# Patient Record
Sex: Male | Born: 1985 | Race: Black or African American | Hispanic: No | Marital: Single | Smoking: Never smoker
Health system: Southern US, Community
[De-identification: ages and names within clinical notes are randomized; demographics above are authoritative.]

---

## 2008-02-19 ENCOUNTER — Emergency Department (HOSPITAL_COMMUNITY): Admission: EM | Admit: 2008-02-19 | Discharge: 2008-02-20 | Payer: Self-pay | Admitting: Emergency Medicine

## 2008-02-24 ENCOUNTER — Ambulatory Visit (HOSPITAL_COMMUNITY): Admission: RE | Admit: 2008-02-24 | Discharge: 2008-02-24 | Payer: Self-pay | Admitting: Orthopedic Surgery

## 2008-03-02 ENCOUNTER — Encounter: Admission: RE | Admit: 2008-03-02 | Discharge: 2008-03-09 | Payer: Self-pay | Admitting: Orthopedic Surgery

## 2008-08-22 ENCOUNTER — Emergency Department (HOSPITAL_COMMUNITY): Admission: EM | Admit: 2008-08-22 | Discharge: 2008-08-22 | Payer: Self-pay | Admitting: Family Medicine

## 2008-09-04 ENCOUNTER — Emergency Department (HOSPITAL_COMMUNITY): Admission: EM | Admit: 2008-09-04 | Discharge: 2008-09-04 | Payer: Self-pay | Admitting: Family Medicine

## 2008-10-04 ENCOUNTER — Emergency Department (HOSPITAL_COMMUNITY): Admission: EM | Admit: 2008-10-04 | Discharge: 2008-10-04 | Payer: Self-pay | Admitting: Emergency Medicine

## 2008-10-08 ENCOUNTER — Emergency Department (HOSPITAL_COMMUNITY): Admission: EM | Admit: 2008-10-08 | Discharge: 2008-10-08 | Payer: Self-pay | Admitting: Family Medicine

## 2008-12-17 IMAGING — CR DG HAND COMPLETE 3+V*R*
3 series · 3 of 3 positions shown · non-contrast
Comparison: None of

CLINICAL DATA: Right hand injury

RIGHT HAND - COMPLETE 3+ VIEW

[x hand pa right]
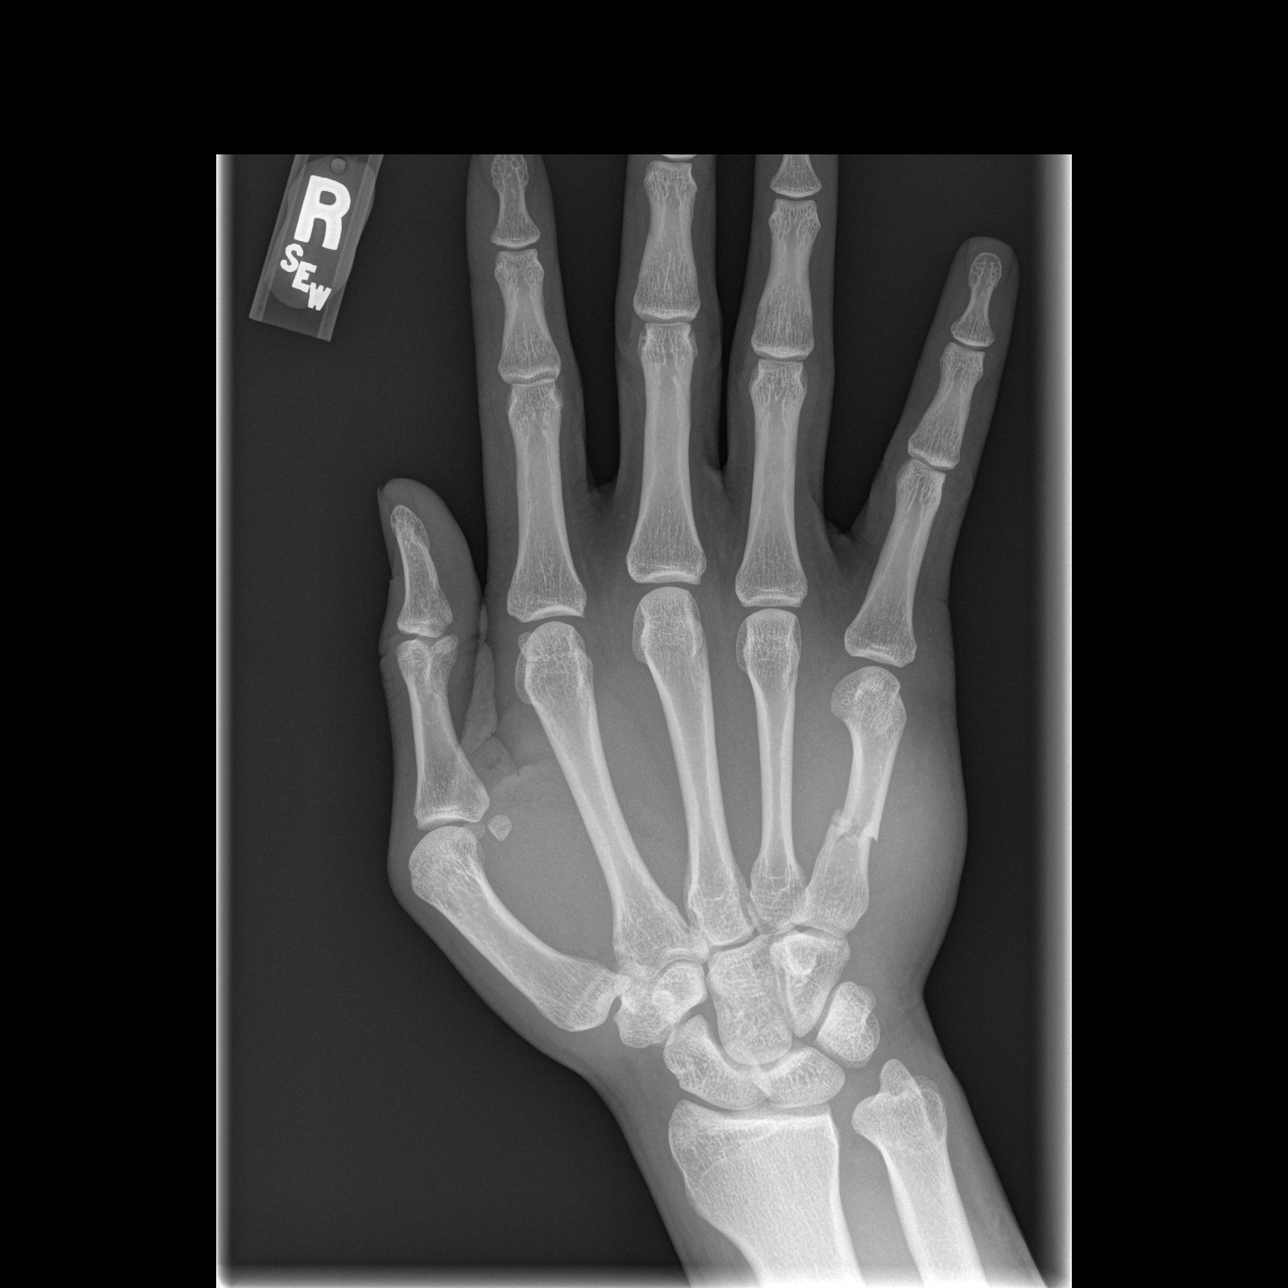

[x hand oblique right]
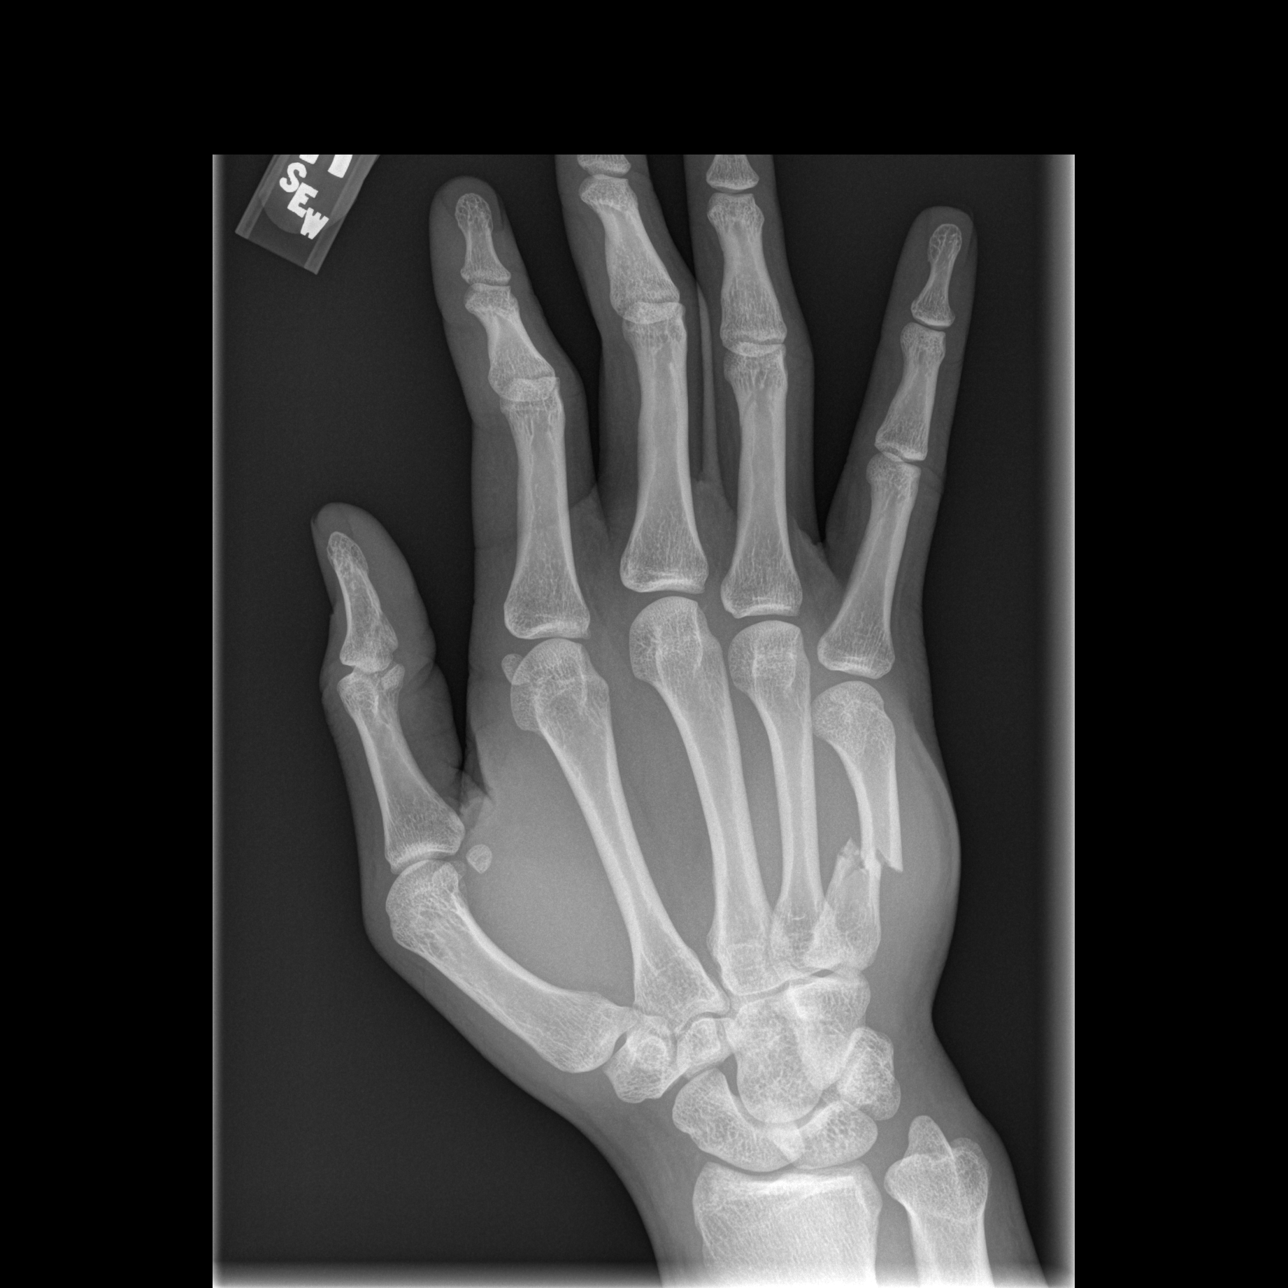

[x hand lat right]
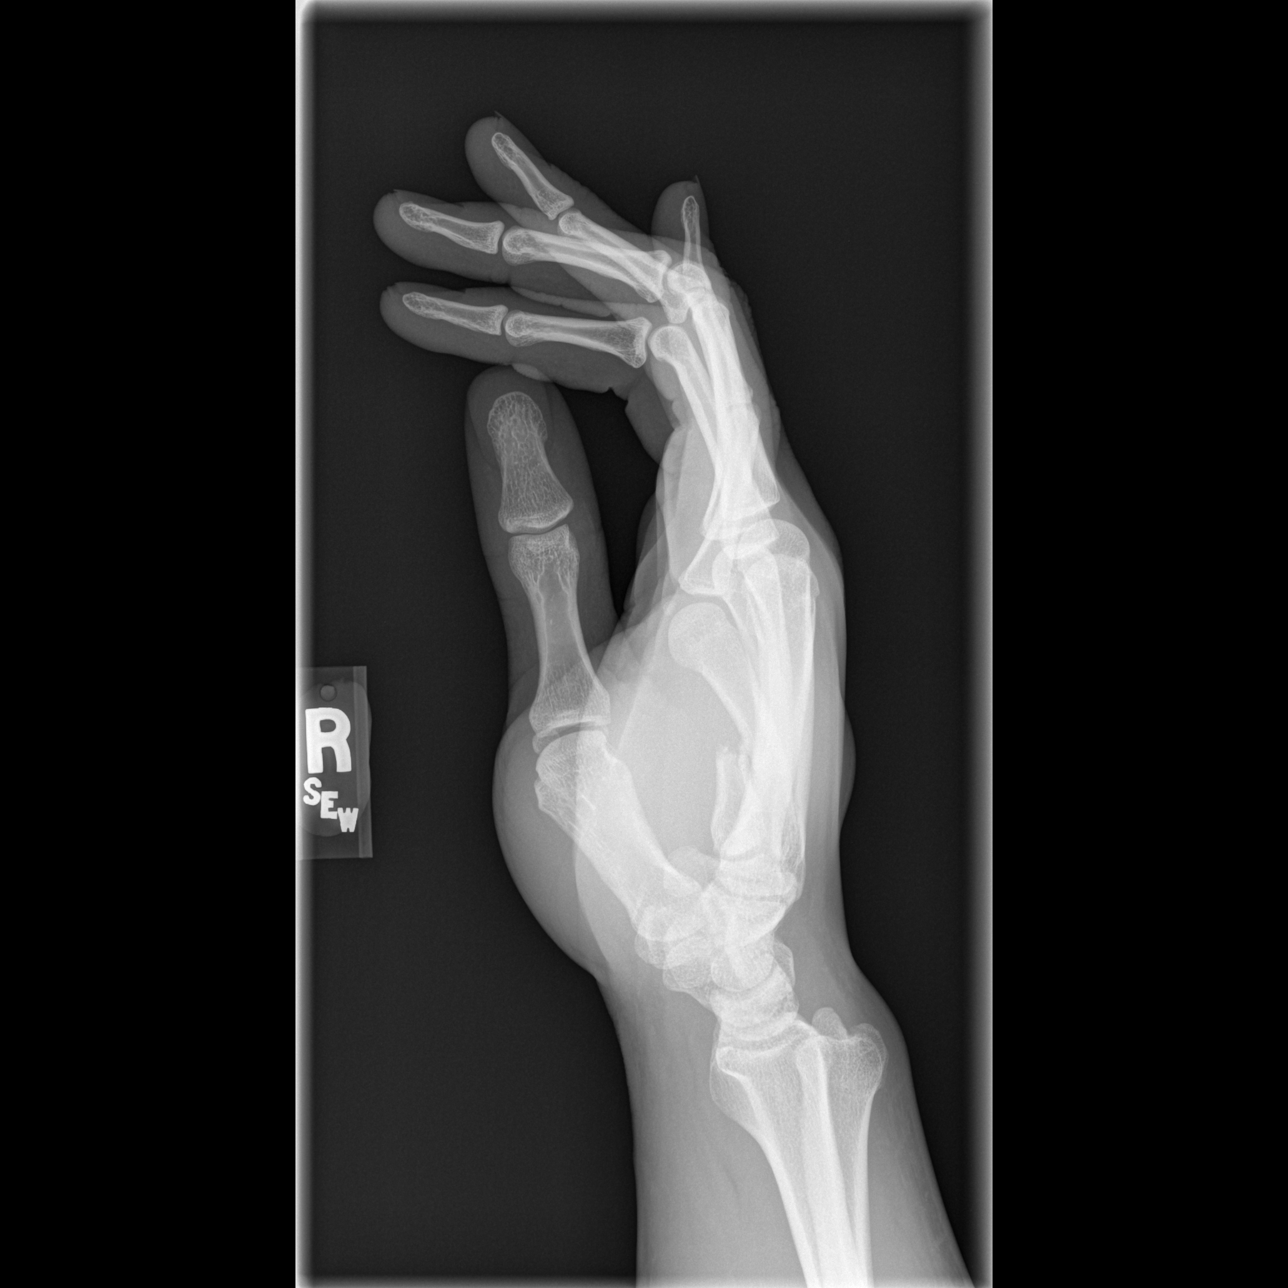

[3 of 3 positions shown; findings below may reference images not displayed]

FINDINGS: Acute transverse fracture of the midshaft right fifth
metacarpal with mild comminution.  There is a volar angulation of
the distal fracture fragment and mild overriding.  Lateral
projection demonstrates  mild dorsal displacement of the distal
ulna relative  to the radius.
IMPRESSION: 1.  Midshaft fracture of the fifth metacarpal.

2.  Question radial ulnar dislocation.

## 2009-06-06 ENCOUNTER — Emergency Department (HOSPITAL_COMMUNITY): Admission: EM | Admit: 2009-06-06 | Discharge: 2009-06-06 | Payer: Self-pay | Admitting: Family Medicine

## 2009-07-13 ENCOUNTER — Emergency Department (HOSPITAL_COMMUNITY): Admission: EM | Admit: 2009-07-13 | Discharge: 2009-07-13 | Payer: Self-pay | Admitting: Emergency Medicine

## 2010-02-22 ENCOUNTER — Emergency Department (HOSPITAL_COMMUNITY): Admission: EM | Admit: 2010-02-22 | Discharge: 2010-02-22 | Payer: Self-pay | Admitting: Family Medicine

## 2011-03-13 ENCOUNTER — Inpatient Hospital Stay (INDEPENDENT_AMBULATORY_CARE_PROVIDER_SITE_OTHER)
Admission: RE | Admit: 2011-03-13 | Discharge: 2011-03-13 | Disposition: A | Discharge status: Home / Self Care | Payer: Self-pay | Source: Ambulatory Visit | Attending: Family Medicine | Admitting: Family Medicine

## 2011-03-13 DIAGNOSIS — R369 Urethral discharge, unspecified: Secondary | ICD-10-CM

## 2011-03-14 NOTE — Op Note (Signed)
NAMEDONTELL, MIAN              ACCOUNT NO.:  [id number]   MEDICAL RECORD NO.:  [id number]          PATIENT TYPE:  AMB   LOCATION:  SDS                          FACILITY:  [facility]   PHYSICIAN:  Artist Pais. Weingold, M.D.DATE OF BIRTH:  1986-03-13   DATE OF PROCEDURE:  02/24/2008  DATE OF DISCHARGE:  02/24/2008                               OPERATIVE REPORT   PREOPERATIVE DIAGNOSIS:  Displaced right fifth metacarpal fracture.   POSTOPERATIVE DIAGNOSIS:  Displaced right fifth metacarpal fracture.   PROCEDURE:  Open reduction and internal fixation of above using 1.6-mm  hand innovation intramedullary rod.   SURGEON:  Artist Pais. Mina Marble, M.D.   ASSISTANT:  None.   ANESTHESIA:  General.   TOURNIQUET TIME:  28 minutes.   COMPLICATIONS:  None.   DRAINS:  None.   OPERATIVE REPORT:  The patient was taken to the operating suite.  After  induction of adequate general anesthesia, the right upper extremity was  prepped and draped in the sterile fashion.  An Esmarch was used to  exsanguinate the limb.  Tourniquet was then inflated to 255 mmHg.  At  this point in time, under fluoroscopic guidance, longitudinal traction  and manual pressure dorsally over the apex angulated proximal third  metacarpal fracture right hand fifth digit was reduced.  A small  incision was made over the base of the metacarpal laterally.  Dissection  was carried down to the bone itself using a hand innovation IM awl.  Small cortical window was made.  The IM rod was introduced into the  intramedullary canal.  Using combination of longitudinal traction and  downward pressure on the distal fragment, the IM rod was passed through  the fracture site.  Reduction was completed.  The IM rod was cut outside  the skin and bent 90 degrees to the long axis of the bone and cut below  the skin.  The wound was irrigated and loosely closed with 4-0 Vicryl  Rapide.  Xeroform, 4 x 4s, fluffs, and an ulnar gutter splint was  applied.  The patient tolerated the procedure well, went to recovery  room in stable fashion.      Artist Pais Mina Marble, M.D.  Electronically Signed     MAW/MEDQ  D:  02/24/2008  T:  02/25/2008  Job:  [id number]

## 2011-07-25 LAB — CBC
HCT: 41.4
Hemoglobin: 13.6
MCHC: 32.9
MCV: 85.3
Platelets: 180
RBC: 4.86
RDW: 11.7
WBC: 2.9 — ABNORMAL LOW

## 2011-08-01 LAB — GC/CHLAMYDIA PROBE AMP, GENITAL
Chlamydia, DNA Probe: POSITIVE — AB
GC Probe Amp, Genital: NEGATIVE

## 2011-08-04 LAB — GC/CHLAMYDIA PROBE AMP, GENITAL: Chlamydia, DNA Probe: POSITIVE — AB

## 2016-04-25 ENCOUNTER — Ambulatory Visit: Payer: Self-pay | Admitting: Psychology

## 2016-05-18 ENCOUNTER — Ambulatory Visit: Payer: Self-pay | Admitting: Psychology

## 2018-03-13 ENCOUNTER — Other Ambulatory Visit: Payer: Self-pay

## 2018-03-13 ENCOUNTER — Encounter (HOSPITAL_COMMUNITY): Payer: Self-pay | Admitting: Family Medicine

## 2018-03-13 ENCOUNTER — Ambulatory Visit (HOSPITAL_COMMUNITY)
Admission: EM | Admit: 2018-03-13 | Discharge: 2018-03-13 | Disposition: A | Discharge status: Home / Self Care | Payer: 59 | Attending: Family Medicine | Admitting: Family Medicine

## 2018-03-13 DIAGNOSIS — R369 Urethral discharge, unspecified: Secondary | ICD-10-CM | POA: Insufficient documentation

## 2018-03-13 DIAGNOSIS — M75101 Unspecified rotator cuff tear or rupture of right shoulder, not specified as traumatic: Secondary | ICD-10-CM | POA: Insufficient documentation

## 2018-03-13 DIAGNOSIS — M75102 Unspecified rotator cuff tear or rupture of left shoulder, not specified as traumatic: Secondary | ICD-10-CM | POA: Diagnosis not present

## 2018-03-13 DIAGNOSIS — M25511 Pain in right shoulder: Secondary | ICD-10-CM | POA: Diagnosis present

## 2018-03-13 MED ORDER — AZITHROMYCIN 250 MG PO TABS
1000.0000 mg | ORAL_TABLET | Freq: Once | ORAL | Status: AC
  Administered 2018-03-13: 1000 mg via ORAL

## 2018-03-13 MED ORDER — AZITHROMYCIN 250 MG PO TABS
ORAL_TABLET | ORAL | Status: AC
  Filled 2018-03-13: qty 4

## 2018-03-13 NOTE — ED Notes (Signed)
Collecting urine specimen

## 2018-03-13 NOTE — ED Provider Notes (Signed)
[facility]   [id number] 03/13/18 Arrival Time: 1601   SUBJECTIVE:  Douglas Pineda is a 32 y.o. male who presents to the urgent care with complaint of urinary symptoms and right shoulder pain.  Patient's had bilateral shoulder pain for several years. 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 He notes a popping in his shoulder joints whenever he tries to throw punch. He feels some discomfort when he internally and externally rotates his shoulders. He is taken for variety of painkillers and nonsteroidals over the years and they've afforded him very little relief.  Patient has not had sexual intercourse in quite a while. Beginning Monday he noticed some white discharge from his penis which persisted despite increasing fluids and drinking cranberry juice. He's had no burning.     History reviewed. No pertinent past medical history. Family History  Problem Relation Age of Onset  . Cancer Mother    Social History   Socioeconomic History  . Marital status: Single    Spouse name: Not on file  . Number of children: Not on file  . Years of education: Not on file  . Highest education level: Not on file  Occupational History  . Not on file  Social Needs  . Financial resource strain: Not on file  . Food insecurity:    Worry: Not on file    Inability: Not on file  . Transportation needs:    Medical: Not on file    Non-medical: Not on file  Tobacco Use  . Smoking status: Never Smoker  Substance and Sexual Activity  . Alcohol use: Never    Frequency: Never  . Drug use: Never  . Sexual activity: Not on file  Lifestyle  . Physical activity:    Days per week: Not on file    Minutes per session: Not on file  . Stress: Not on file  Relationships  . Social connections:    Talks on phone: Not on file    Gets together: Not on file    Attends religious service: Not on file    Active member of club or organization: Not on file   Attends meetings of clubs or organizations: Not on file    Relationship status: Not on file  . Intimate partner violence:    Fear of current or ex partner: Not on file    Emotionally abused: Not on file    Physically abused: Not on file    Forced sexual activity: Not on file  Other Topics Concern  . Not on file  Social History Narrative  . Not on file   No outpatient medications have been marked as taking for the 03/13/18 encounter [facility] Encounter).   No Known Allergies    ROS: As per HPI, remainder of ROS negative.   OBJECTIVE:   Vitals:   03/13/18 1819  BP: 127/60  Pulse: (!) 54  Resp: 20  Temp: 97.8 F (36.6 C)  TempSrc: Oral  SpO2: 100%     General appearance: alert; no distress Eyes: PERRL; EOMI; conjunctiva normal HENT: normocephalic; atraumatic; TMs normal, canal normal, external ears normal without trauma; nasal mucosa normal; oral mucosa normal Neck: supple Back: no CVA tenderness Extremities: no cyanosis or edema; symmetrical with no gross deformities; good range of motion both shoulders but he does have pain with resisted internal and external rotation in the shoulder region. He has mild tenderness in the anterior joint line of the right shoulder. Skin:  warm and dry Neurologic: normal gait; grossly normal Psychological: alert and cooperative; normal mood and affect      Labs:    Labs Reviewed  URINE CYTOLOGY ANCILLARY ONLY    No results found.     ASSESSMENT & PLAN:  1. Rotator cuff syndrome of both shoulders   2. Penile discharge   Please make an appointment with Ellamae Sia. He will give you exercise to strengthen your rotator cuff muscles in her shoulders.  We are running tests on the urine which should be back in 2 days. We will call you if you need to take additional medication.  Meds ordered this encounter  Medications  . azithromycin (ZITHROMAX) tablet 1,000 mg    Reviewed expectations re: course of current medical  issues. Questions answered. Outlined signs and symptoms indicating need for more acute intervention. Patient verbalized understanding. After Visit Summary given.    Procedures:      Elvina Sidle, MD 03/13/18 1610

## 2018-03-13 NOTE — ED Triage Notes (Signed)
Noticed discharge in urination on Monday.    Right shoulder has chronic issues.  Recurrent  Pain, denies any recent injury

## 2018-03-13 NOTE — Discharge Instructions (Addendum)
Please make an appointment with Ellamae Sia. He will give you exercise to strengthen your rotator cuff muscles in her shoulders.  We are running tests on the urine which should be back in 2 days. We will call you if you need to take additional medication.

## 2018-03-14 ENCOUNTER — Telehealth (HOSPITAL_COMMUNITY): Payer: Self-pay

## 2018-03-14 LAB — URINE CYTOLOGY ANCILLARY ONLY
Chlamydia: POSITIVE — AB
Neisseria Gonorrhea: NEGATIVE
Trichomonas: NEGATIVE

## 2018-03-14 NOTE — Telephone Encounter (Signed)
Pt called back and is aware of results.  

## 2018-03-14 NOTE — Telephone Encounter (Signed)
Chlamydia is positive.  This was treated at the urgent care visit with po zithromax 1g.  Need to educated pt to please refrain from sexual intercourse for 7 days to give the medicine time to work.  Sexual partners need to be notified and tested/treated.  Condoms may reduce risk of reinfection.  Recheck or followup with PCP for further evaluation if symptoms are not improving.  [facility] notified. Attempted to reach patient. No answer at this time.

## 2024-08-15 ENCOUNTER — Other Ambulatory Visit: Payer: Self-pay | Admitting: Orthopaedic Surgery

## 2024-08-15 DIAGNOSIS — M19012 Primary osteoarthritis, left shoulder: Secondary | ICD-10-CM

## 2024-08-28 ENCOUNTER — Ambulatory Visit
Admission: RE | Admit: 2024-08-28 | Discharge: 2024-08-28 | Disposition: A | Discharge status: Home / Self Care | Source: Ambulatory Visit | Attending: Orthopaedic Surgery | Admitting: Orthopaedic Surgery

## 2024-08-28 DIAGNOSIS — M19012 Primary osteoarthritis, left shoulder: Secondary | ICD-10-CM
